# Patient Record
Sex: Male | Born: 1995
Health system: Southern US, Community
[De-identification: ages and names within clinical notes are randomized; demographics above are authoritative.]

---
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
## 2016-10-07 ENCOUNTER — Ambulatory Visit (INDEPENDENT_AMBULATORY_CARE_PROVIDER_SITE_OTHER): Payer: BLUE CROSS/BLUE SHIELD | Admitting: Medical

## 2016-10-07 ENCOUNTER — Encounter: Payer: Self-pay | Admitting: Medical

## 2016-10-07 VITALS — BP 123/64 | HR 72 | Temp 98.0°F | Resp 16 | Ht 65.75 in | Wt 127.4 lb

## 2016-10-07 DIAGNOSIS — L0390 Cellulitis, unspecified: Secondary | ICD-10-CM

## 2016-10-07 MED ORDER — CEFTRIAXONE SODIUM 1 G IJ SOLR
1.0000 g | Freq: Once | INTRAMUSCULAR | Status: AC
  Administered 2016-10-07: 1 g via INTRAMUSCULAR

## 2016-10-07 MED ORDER — DOXYCYCLINE HYCLATE 100 MG PO TABS: 100.0000 mg | ORAL_TABLET | Freq: Two times a day (BID) | ORAL | 0 refills | Status: DC

## 2016-10-07 NOTE — Patient Instructions (Addendum)
Your lower abdomen/suprapubic region appears to have cellulitis but not abscess.  We gave you rocphen 1 gram and rx doxycycline oral antibiotic.  Apply warm compresses twice daily.(epson salt water soaked gauzes).  If area of redness expands or if soft center to infected area with increased pain then ED evaluation for I and D.   Follow up Tuesday or sooner if needed

## 2016-10-07 NOTE — Progress Notes (Signed)
   Subjective:    Patient ID: Mark Collier, male    DOB: 1996/08/18, 21 y.o.   MRN: [id number]  HPI  Pt in with swollen mid lower suprapubic area present for 5 days. Pt hair is shaved in suprapubic region. Pt states noticed after wrestling the area feels worse. No upcoming matches for 3 weeks.   Pt has no fevers, no chills or sweats.  No history of skin infections. No dc reported.     Review of Systems  Constitutional: Negative for chills, fatigue and fever.  Respiratory: Negative for cough, choking, shortness of breath and wheezing.   Cardiovascular: Negative for chest pain and palpitations.  Gastrointestinal: Negative for abdominal pain.  Skin:       Cellulitis in lower abdomen suprapubic region.  Hematological: Negative for adenopathy. Does not bruise/bleed easily.  Psychiatric/Behavioral: Negative for behavioral problems and confusion.   No past medical history on file.   Social History   Social History  . Marital status: Single    Spouse name: N/A  . Number of children: N/A  . Years of education: N/A   Occupational History  . Not on file.   Social History Main Topics  . Smoking status: Never Smoker  . Smokeless tobacco: Never Used  . Alcohol use No  . Drug use: No  . Sexual activity: Not on file   Other Topics Concern  . Not on file   Social History Narrative  . No narrative on file    No past surgical history on file.  Family History  Problem Relation Age of Onset  . Hyperlipidemia    . Heart attack Maternal Grandfather     mggf  . Angina Paternal Grandfather   . Lung cancer Maternal Grandfather     mggf  . Endometriosis Maternal Grandmother     mggm  . Hypertension    . COPD Paternal Grandmother     smoker    No Known Allergies  Current Outpatient Prescriptions on File Prior to Visit  Medication Sig Dispense Refill  . mupirocin ointment (BACTROBAN) 2 %     . naftifine (NAFTIN) 1 % cream Apply topically daily. 30 g 0   No current  facility-administered medications on file prior to visit.     BP 123/64 (BP Location: Left Arm, Patient Position: Sitting, Cuff Size: Normal)   Pulse 72   Temp 98 F (36.7 C) (Oral)   Resp 16   Ht 5' 5.75" (1.67 m)   Wt 127 lb 6 oz (57.8 kg)   SpO2 100%   BMI 20.72 kg/m       Objective:   Physical Exam  General- No acute distress. Pleasant patient.  Lungs- Clear, even and unlabored. Heart- regular rate and rhythm. Neurologic- CNII- XII grossly intact.  Abdomen- lower suprapubic mid-line 2 cm red area. Inflammed and tender but not fluctuant in the middle.   Back- no cva tenderness        Assessment & Plan:  Your lower abdomen/suprapubic region appears to have cellulitis but not abscess.  We gave you rocphein 1 gram and rx doxycycline oral antibiotic.  Apply warm compresses twice daily.(epson salt water soaked gauzes).  If area of redness expands or if soft center to infected area with increased pain then ED evaluation for I and D.   Follow up Tuesday or sooner if needed

## 2016-10-07 NOTE — Progress Notes (Signed)
Pre visit review using our clinic review tool, if applicable. No additional management support is needed unless otherwise documented below in the visit note/SLS  

## 2016-10-11 ENCOUNTER — Encounter: Payer: Self-pay | Admitting: Medical

## 2016-10-11 ENCOUNTER — Ambulatory Visit (INDEPENDENT_AMBULATORY_CARE_PROVIDER_SITE_OTHER): Payer: BLUE CROSS/BLUE SHIELD | Admitting: Medical

## 2016-10-11 VITALS — BP 132/75 | HR 86 | Temp 98.0°F | Resp 16 | Ht 66.0 in | Wt 122.2 lb

## 2016-10-11 DIAGNOSIS — L089 Local infection of the skin and subcutaneous tissue, unspecified: Secondary | ICD-10-CM | POA: Diagnosis not present

## 2016-10-11 MED ORDER — DOXYCYCLINE HYCLATE 100 MG PO TABS: 100.0000 mg | ORAL_TABLET | Freq: Two times a day (BID) | ORAL | 0 refills | Status: DC

## 2016-10-11 NOTE — Progress Notes (Signed)
Pre visit review using our clinic review tool, if applicable. No additional management support is needed unless otherwise documented below in the visit note/SLS  

## 2016-10-11 NOTE — Patient Instructions (Signed)
Your are of skin infection is healing very well now. By description abscess did form and did burst as well. Continue the oral antibiotic for 5 day. Will provide an additional 3 days of doxycyline to use if you need. I will go ahead and write return to wrestling practice in 7 days provided you continue to see improvement and have no further drainage in 7 days. If you do see any drainage then need to see you back in a week for recheck/reeval.

## 2016-10-11 NOTE — Progress Notes (Signed)
Subjective:    Patient ID: Mark Collier, male    DOB: [birth year], 21 y.o.   MRN: [id number]  HPI   Pt in for follow up. Seen on 19 th for suprapubic area cellulitis. States taking antiibiotics well with no side effect. 2 days ago he woke up and saw area burst and drainage. Only faint tender now at drainage site. No fever, no chills or sweats.    Review of Systems  Constitutional: Negative for chills, fatigue and fever.  HENT: Positive for congestion.        Counseled on delsym otc for cough and congestion. If symptosm worsen or change let us know.  Respiratory: Negative for cough, chest tightness, shortness of breath and wheezing.   Cardiovascular: Negative for chest pain and palpitations.  Gastrointestinal: Negative for abdominal pain, blood in stool, constipation, diarrhea and nausea.  Musculoskeletal: Negative for back pain.  Skin: Negative for rash.  Neurological: Negative for dizziness and headaches.  Hematological: Negative for adenopathy. Does not bruise/bleed easily.  Psychiatric/Behavioral: Negative for behavioral problems and confusion.   No past medical history on file.   Social History   Social History  . Marital status: Single    Spouse name: N/A  . Number of children: N/A  . Years of education: N/A   Occupational History  . Not on file.   Social History Main Topics  . Smoking status: Never Smoker  . Smokeless tobacco: Never Used  . Alcohol use No  . Drug use: No  . Sexual activity: Not on file   Other Topics Concern  . Not on file   Social History Narrative  . No narrative on file    No past surgical history on file.  Family History  Problem Relation Age of Onset  . Hyperlipidemia    . Heart attack Maternal Grandfather     mggf  . Angina Paternal Grandfather   . Lung cancer Maternal Grandfather     mggf  . Endometriosis Maternal Grandmother     mggm  . Hypertension    . COPD Paternal Grandmother     smoker    No Known  Allergies  Current Outpatient Prescriptions on File Prior to Visit  Medication Sig Dispense Refill  . doxycycline (VIBRA-TABS) 100 MG tablet Take 1 tablet (100 mg total) by mouth 2 (two) times daily. 20 tablet 0  . mupirocin ointment (BACTROBAN) 2 %     . naftifine (NAFTIN) 1 % cream Apply topically daily. 30 g 0   No current facility-administered medications on file prior to visit.     BP 132/75 (BP Location: Left Arm, Patient Position: Sitting, Cuff Size: Normal)   Pulse 86   Temp 98 F (36.7 C) (Oral)   Resp 16   Ht 5\' 6"  (1.676 m)   Wt 122 lb 4 oz (55.5 kg)   SpO2 99%   BMI 19.73 kg/m       Objective:   Physical Exam  General- No acute distress. Pleasant patient.  .  Abdomen- lower suprapubic prior area of skin infection now flat faint pink. Not warm and not tender.  Back- no cva tenderness      Assessment & Plan:  Your are of skin infection is healing very well now. By description abscess did form and did burst as well. Continue the oral antibiotic for 5 day. Will provide an additional 3 days of doxycyline to use if you need. I will go ahead and write return to wrestling practice in  7 days provided you continue to see improvement and have no further drainage in 7 days. If you do see any drainage then need to see you back in a week for recheck/reeval.

## 2017-02-09 ENCOUNTER — Emergency Department (HOSPITAL_COMMUNITY)
Admission: EM | Admit: 2017-02-09 | Discharge: 2017-02-09 | Disposition: A | Discharge status: Home / Self Care | Payer: BLUE CROSS/BLUE SHIELD | Attending: Emergency Medicine | Admitting: Emergency Medicine

## 2017-02-09 ENCOUNTER — Encounter (HOSPITAL_COMMUNITY): Payer: Self-pay

## 2017-02-09 DIAGNOSIS — Y999 Unspecified external cause status: Secondary | ICD-10-CM | POA: Diagnosis not present

## 2017-02-09 DIAGNOSIS — Z79899 Other long term (current) drug therapy: Secondary | ICD-10-CM | POA: Insufficient documentation

## 2017-02-09 DIAGNOSIS — Y9372 Activity, wrestling: Secondary | ICD-10-CM | POA: Insufficient documentation

## 2017-02-09 DIAGNOSIS — S0101XA Laceration without foreign body of scalp, initial encounter: Secondary | ICD-10-CM | POA: Diagnosis not present

## 2017-02-09 DIAGNOSIS — S0990XA Unspecified injury of head, initial encounter: Secondary | ICD-10-CM

## 2017-02-09 DIAGNOSIS — Z23 Encounter for immunization: Secondary | ICD-10-CM | POA: Diagnosis not present

## 2017-02-09 DIAGNOSIS — W1789XA Other fall from one level to another, initial encounter: Secondary | ICD-10-CM | POA: Insufficient documentation

## 2017-02-09 DIAGNOSIS — Y929 Unspecified place or not applicable: Secondary | ICD-10-CM | POA: Diagnosis not present

## 2017-02-09 MED ORDER — TETANUS-DIPHTH-ACELL PERTUSSIS 5-2.5-18.5 LF-MCG/0.5 IM SUSP
0.5000 mL | Freq: Once | INTRAMUSCULAR | Status: AC
  Administered 2017-02-09: 0.5 mL via INTRAMUSCULAR
  Filled 2017-02-09: qty 0.5

## 2017-02-09 NOTE — ED Provider Notes (Signed)
TIME SEEN: 1:09 AM   02/09/2017  CHIEF COMPLAINT: Head injury  HPI: This is a 21 year old male without chronic medical problems who presents to the ED following a head injury. The patient states that three hours ago he was "wrestling with his friend" when he struck his forehead against the side of his wooden porch. No LOC. He has a laceration to the R forehead at the hairline with bleeding and mild associated pain. He denies any other injuries. No chest pain or dyspnea. No neck or back pain. Did have some dizziness after but this has resolved. No severe headache or nausea, vomiting. No numbness or focal weakness. Not on anticoagulation, antiplatelets. Reports 1 shot of liquor tonight. No drug use.  ROS: See HPI Constitutional: no fever  Eyes: no drainage  ENT: no runny nose   Cardiovascular:  no chest pain  Resp: no SOB  GI: no vomiting GU: no dysuria Integumentary: no rash + laceration  Allergy: no hives  Musculoskeletal: no leg swelling  Neurological: no slurred speech ROS otherwise negative  PAST MEDICAL HISTORY/PAST SURGICAL HISTORY:  History reviewed. No pertinent past medical history.  MEDICATIONS:  Prior to Admission medications   Medication Sig Start Date End Date Taking? Authorizing Provider  doxycycline (VIBRA-TABS) 100 MG tablet Take 1 tablet (100 mg total) by mouth 2 (two) times daily. 10/11/16   Saguier, Ramon DredgeEdward, PA-C  mupirocin ointment (BACTROBAN) 2 %  12/29/13   [provider]  naftifine (NAFTIN) 1 % cream Apply topically daily. 03/11/14   Donato SchultzLowne Chase, Yvonne R, DO    ALLERGIES:  No Known Allergies  SOCIAL HISTORY:  Social History  Substance Use Topics  . Smoking status: Never Smoker  . Smokeless tobacco: Never Used  . Alcohol use No    FAMILY HISTORY: Family History  Problem Relation Age of Onset  . Hyperlipidemia Unknown   . Heart attack Maternal Grandfather        mggf  . Lung cancer Maternal Grandfather        mggf  . Angina Paternal  Grandfather   . Endometriosis Maternal Grandmother        mggm  . Hypertension Unknown   . COPD Paternal Grandmother        smoker    EXAM: BP 122/74 (BP Location: Right Arm)   Pulse 88   Temp 97.2 F (36.2 C) (Oral)   Resp 18   Ht 5\' 6"  (1.676 m)   Wt 120 lb (54.4 kg)   SpO2 98%   BMI 19.37 kg/m  CONSTITUTIONAL: Alert and oriented and responds appropriately to questions. Well-appearing; well-nourished; GCS 15 HEAD: Normocephalic; there is a 3cm laceration to the R forehead above the hairline. EYES: Conjunctivae clear, PERRL, EOMI ENT: normal nose; no rhinorrhea; moist mucous membranes; pharynx without lesions noted; no dental injury; no septal hematoma NECK: Supple, no meningismus, no LAD; no midline spinal tenderness, step-off or deformity; trachea midline CARD: RRR; S1 and S2 appreciated; no murmurs, no clicks, no rubs, no gallops RESP: Normal chest excursion without splinting or tachypnea; breath sounds clear and equal bilaterally; no wheezes, no rhonchi, no rales; no hypoxia or respiratory distress CHEST:  chest wall stable, no crepitus or ecchymosis or deformity, nontender to palpation; no flail chest ABD/GI: Normal bowel sounds; non-distended; soft, non-tender, no rebound, no guarding; no ecchymosis or other lesions noted PELVIS:  stable, nontender to palpation BACK:  The back appears normal and is non-tender to palpation, there is no CVA tenderness; no midline spinal  tenderness, step-off or deformity EXT: Normal ROM in all joints; non-tender to palpation; no edema; normal capillary refill; no cyanosis, no bony tenderness or bony deformity of patient's extremities, no joint effusion, compartments are soft, extremities are warm and well-perfused, no ecchymosis SKIN: Normal color for age and race; warm NEURO: Moves all extremities equally, sensation to light touch intact diffusely, cranial nerves II through XII intact, normal speech PSYCH: The patient's mood and manner are  appropriate. Grooming and personal hygiene are appropriate.  MEDICAL DECISION MAKING: Patient here with minor head injury with small head laceration has been repaired with staples. Neurologically intact, hemodynamically stable. No other sign of trauma on exam. Does not appear intoxicated. Discussed head injury return precautions. We'll update his tetanus vaccination. Discussed wound care instructions. He has a PCP to have his staples removed in one week. I do not feel he needs emergent head imaging. Patient family comfortable with this plan. Recommended alternating Tylenol and Motrin at home as needed for pain.  At this time, I do not feel there is any life-threatening condition present. I have reviewed and discussed all results (EKG, imaging, lab, urine as appropriate) and exam findings with patient/family. I have reviewed nursing notes and appropriate previous records.  I feel the patient is safe to be discharged home without further emergent workup and can continue workup as an outpatient as needed. Discussed usual and customary return precautions. Patient/family verbalize understanding and are comfortable with this plan.  Outpatient follow-up has been provided if needed. All questions have been answered.  LACERATION REPAIR Performed by: Raelyn Number Authorized by: Raelyn Number Consent: Verbal consent obtained. Risks and benefits: risks, benefits and alternatives were discussed Consent given by: patient Patient identity confirmed: provided demographic data Prepped and Draped in normal sterile fashion Wound explored  Laceration Location: Scalp  Laceration Length: 3 cm  No Foreign Bodies seen or palpated  Anesthesia: local infiltration  Local anesthetic: Patient declined   Anesthetic total: 0 ml  Irrigation method: syringe Amount of cleaning: standard  Skin closure: Staples   Number of sutures/staples: 3 staples   Technique: Wound irrigated copiously with sterile saline. .  Wound closed using 3 staples.  Sterile dressing applied. Good wound approximation and hemostasis achieved.    Patient tolerance: Patient tolerated the procedure well with no immediate complications.     I personally performed the services described in this documentation, which was scribed in my presence. The recorded information has been reviewed and is accurate.    Ward, Layla Maw, DO 02/09/17 [phone]

## 2017-02-09 NOTE — ED Notes (Signed)
ED Provider at bedside. 

## 2017-02-09 NOTE — Discharge Instructions (Signed)
You may alternate Tylenol 1000 mg every 6 hours as needed for pain and Ibuprofen 800 mg every 8 hours as needed for pain.  Please take Ibuprofen with food.   Please avoid alcohol for the next week.  Please rest and drink plenty of water.  We recommend that you avoid any activity that may lead to another head injury for at least 1 week or until your symptoms have completely resolved.  We also recommend "brain rest" - please avoid TV, cell phones, tablets, computers as much as possible for the next 48 hours.     I recommend you stay out of work tomorrow. Your safe to drive as long as you're not experiencing severe headache, dizziness, nausea and vomiting, vision changes.

## 2017-02-09 NOTE — ED Triage Notes (Signed)
Pt here for fall from porch. Laceration noted to forehead.

## 2017-07-20 ENCOUNTER — Other Ambulatory Visit: Payer: Self-pay | Admitting: *Deleted

## 2017-07-20 ENCOUNTER — Ambulatory Visit (INDEPENDENT_AMBULATORY_CARE_PROVIDER_SITE_OTHER): Payer: BLUE CROSS/BLUE SHIELD

## 2017-07-20 DIAGNOSIS — Z23 Encounter for immunization: Secondary | ICD-10-CM | POA: Diagnosis not present

## 2017-09-19 HISTORY — PX: APPENDECTOMY: SHX54

## 2017-09-29 ENCOUNTER — Encounter: Payer: Self-pay | Admitting: Family Medicine

## 2017-09-29 ENCOUNTER — Ambulatory Visit (INDEPENDENT_AMBULATORY_CARE_PROVIDER_SITE_OTHER): Payer: BLUE CROSS/BLUE SHIELD | Admitting: Family Medicine

## 2017-09-29 VITALS — BP 98/68 | HR 92 | Temp 97.7°F | Resp 16 | Ht 66.0 in | Wt 115.2 lb

## 2017-09-29 DIAGNOSIS — Z0000 Encounter for general adult medical examination without abnormal findings: Secondary | ICD-10-CM

## 2017-09-29 DIAGNOSIS — Z23 Encounter for immunization: Secondary | ICD-10-CM | POA: Diagnosis not present

## 2017-09-29 NOTE — Assessment & Plan Note (Signed)
ghm utd Check labs See AVS 

## 2017-09-29 NOTE — Progress Notes (Addendum)
Patient ID: Mark Collier, male    DOB: 1996/08/12  Age: 22 y.o. MRN: [id number]    Subjective:  Subjective  HPI Mark Collier presents for cpe , no complaints.    Review of Systems  Constitutional: Negative for chills and fever.  HENT: Negative for congestion and hearing loss.   Eyes: Negative for discharge.  Respiratory: Negative for cough and shortness of breath.   Cardiovascular: Negative for chest pain, palpitations and leg swelling.  Gastrointestinal: Negative for abdominal pain, blood in stool, constipation, diarrhea, nausea and vomiting.  Genitourinary: Negative for dysuria, frequency, hematuria and urgency.  Musculoskeletal: Negative for back pain and myalgias.  Skin: Negative for rash.  Allergic/Immunologic: Negative for environmental allergies.  Neurological: Negative for dizziness, weakness and headaches.  Hematological: Does not bruise/bleed easily.  Psychiatric/Behavioral: Negative for suicidal ideas. The patient is not nervous/anxious.     History History reviewed. No pertinent past medical history.  He has no past surgical history on file.   His family history includes Angina in his paternal grandfather; COPD in his paternal grandmother; Endometriosis in his maternal grandmother; Heart attack in his maternal grandfather; Hyperlipidemia in his unknown relative; Hypertension in his unknown relative; Lung cancer in his maternal grandfather.He reports that  has never smoked. he has never used smokeless tobacco. He reports that he uses drugs. Drug: Marijuana. He reports that he does not drink alcohol.  No current outpatient medications on file prior to visit.   No current facility-administered medications on file prior to visit.      Objective:  Objective  Physical Exam  Constitutional: He is oriented to person, place, and time. Vital signs are normal. He appears well-developed and well-nourished. He is sleeping.  HENT:  Head: Normocephalic and atraumatic.    Mouth/Throat: Oropharynx is clear and moist.  Eyes: EOM are normal. Pupils are equal, round, and reactive to light.  Neck: Normal range of motion. Neck supple. No thyromegaly present.  Cardiovascular: Normal rate and regular rhythm.  No murmur heard. Pulmonary/Chest: Effort normal and breath sounds normal. No respiratory distress. He has no wheezes. He has no rales. He exhibits no tenderness.  Abdominal: Soft. Bowel sounds are normal. He exhibits no distension and no mass. There is no tenderness. There is no rebound and no guarding. No hernia. Hernia confirmed negative in the ventral area, confirmed negative in the right inguinal area and confirmed negative in the left inguinal area.  Musculoskeletal: He exhibits no edema or tenderness.  Neurological: He is alert and oriented to person, place, and time.  Skin: Skin is warm and dry.  Psychiatric: He has a normal mood and affect. His behavior is normal. Judgment and thought content normal.  Nursing note and vitals reviewed.  BP 98/68 (BP Location: Left Arm, Cuff Size: Normal)   Pulse 92   Temp 97.7 F (36.5 C) (Oral)   Resp 16   Ht 5\' 6"  (1.676 m)   Wt 115 lb 3.2 oz (52.3 kg)   SpO2 96%   BMI 18.59 kg/m  Wt Readings from Last 3 Encounters:  09/29/17 115 lb 3.2 oz (52.3 kg)  02/09/17 120 lb (54.4 kg)  10/11/16 122 lb 4 oz (55.5 kg) (5 %, Z= -1.65)*   * Growth percentiles are based on CDC (Boys, 2-20 Years) data.     Lab Results  Component Value Date   WBC 4.6 11/06/2008   HGB 14.4 11/06/2008   HCT 41.7 11/06/2008   PLT 202 11/06/2008   GLUCOSE 95 11/06/2008  CHOL 156 11/06/2008   TRIG 55 11/06/2008   HDL 48.8 11/06/2008   LDLCALC 96 11/06/2008   ALT 22 11/06/2008   AST 29 11/06/2008   NA 140 11/06/2008   K 5.1 11/06/2008   CL 107 11/06/2008   CREATININE 0.5 11/06/2008   BUN 9 11/06/2008   CO2 26 11/06/2008    No results found.   Assessment & Plan:  Plan  I have discontinued Mark Collier's mupirocin ointment,  naftifine, and doxycycline.  No orders of the defined types were placed in this encounter.   Problem List Items Addressed This Visit      Unprioritized   Preventative health care - Primary    ghm utd Check labs See AVS      Relevant Orders   Comprehensive metabolic panel   CBC with Differential/Platelet   Lipid panel   TSH    menveo, men B and flu given today   Follow-up: Return in about 1 month (around 10/30/2017) for men B  #2.  Mark SchultzYvonne R Lowne Chase, DO

## 2017-09-29 NOTE — Patient Instructions (Signed)
Preventive Care 18-39 Years, Male Preventive care refers to lifestyle choices and visits with your health care provider that can promote health and wellness. What does preventive care include?  A yearly physical exam. This is also called an annual well check.  Dental exams once or twice a year.  Routine eye exams. Ask your health care provider how often you should have your eyes checked.  Personal lifestyle choices, including: ? Daily care of your teeth and gums. ? Regular physical activity. ? Eating a healthy diet. ? Avoiding tobacco and drug use. ? Limiting alcohol use. ? Practicing safe sex. What happens during an annual well check? The services and screenings done by your health care provider during your annual well check will depend on your age, overall health, lifestyle risk factors, and family history of disease. Counseling Your health care provider may ask you questions about your:  Alcohol use.  Tobacco use.  Drug use.  Emotional well-being.  Home and relationship well-being.  Sexual activity.  Eating habits.  Work and work Statistician.  Screening You may have the following tests or measurements:  Height, weight, and BMI.  Blood pressure.  Lipid and cholesterol levels. These may be checked every 5 years starting at age 34.  Diabetes screening. This is done by checking your blood sugar (glucose) after you have not eaten for a while (fasting).  Skin check.  Hepatitis C blood test.  Hepatitis B blood test.  Sexually transmitted disease (STD) testing.  Discuss your test results, treatment options, and if necessary, the need for more tests with your health care provider. Vaccines Your health care provider may recommend certain vaccines, such as:  Influenza vaccine. This is recommended every year.  Tetanus, diphtheria, and acellular pertussis (Tdap, Td) vaccine. You may need a Td booster every 10 years.  Varicella vaccine. You may need this if you  have not been vaccinated.  HPV vaccine. If you are 23 or younger, you may need three doses over 6 months.  Measles, mumps, and rubella (MMR) vaccine. You may need at least one dose of MMR.You may also need a second dose.  Pneumococcal 13-valent conjugate (PCV13) vaccine. You may need this if you have certain conditions and have not been vaccinated.  Pneumococcal polysaccharide (PPSV23) vaccine. You may need one or two doses if you smoke cigarettes or if you have certain conditions.  Meningococcal vaccine. One dose is recommended if you are age 65-21 years and a first-year college student living in a residence hall, or if you have one of several medical conditions. You may also need additional booster doses.  Hepatitis A vaccine. You may need this if you have certain conditions or if you travel or work in places where you may be exposed to hepatitis A.  Hepatitis B vaccine. You may need this if you have certain conditions or if you travel or work in places where you may be exposed to hepatitis B.  Haemophilus influenzae type b (Hib) vaccine. You may need this if you have certain risk factors.  Talk to your health care provider about which screenings and vaccines you need and how often you need them. This information is not intended to replace advice given to you by your health care provider. Make sure you discuss any questions you have with your health care provider. Document Released: 11/01/2001 Document Revised: 05/25/2016 Document Reviewed: 07/07/2015 Elsevier Interactive Patient Education  [employer].

## 2017-09-29 NOTE — Addendum Note (Signed)
Addended by: Thelma BargeICHARDSON, Jalyn Dutta D on: 09/29/2017 02:54 PM   Modules accepted: Orders

## 2017-10-03 NOTE — Addendum Note (Signed)
Addended by: Thelma BargeICHARDSON, Christinia Lambeth D on: 10/03/2017 08:31 AM   Modules accepted: Orders

## 2017-11-03 ENCOUNTER — Ambulatory Visit: Payer: BLUE CROSS/BLUE SHIELD

## 2017-11-10 ENCOUNTER — Ambulatory Visit (INDEPENDENT_AMBULATORY_CARE_PROVIDER_SITE_OTHER): Payer: BLUE CROSS/BLUE SHIELD

## 2017-11-10 DIAGNOSIS — Z23 Encounter for immunization: Secondary | ICD-10-CM

## 2017-11-10 NOTE — Progress Notes (Signed)
Patient in today to receive his second Men B vaccine.  He tolerated it well.

## 2018-10-18 ENCOUNTER — Encounter: Payer: Self-pay | Admitting: Internal Medicine

## 2018-10-18 ENCOUNTER — Ambulatory Visit (INDEPENDENT_AMBULATORY_CARE_PROVIDER_SITE_OTHER): Payer: Managed Care, Other (non HMO) | Admitting: Internal Medicine

## 2018-10-18 VITALS — BP 122/70 | HR 112 | Temp 98.0°F | Resp 16 | Ht 66.0 in | Wt 119.1 lb

## 2018-10-18 DIAGNOSIS — N5089 Other specified disorders of the male genital organs: Secondary | ICD-10-CM

## 2018-10-18 NOTE — Progress Notes (Signed)
Subjective:    Patient ID: Mark Collier, male    DOB: September 01, 1996, 23 y.o.   MRN: [id number]  DOS:  10/18/2018 Type of visit - description: Acute visit Develop a lump on the right side of the scrotum few months ago, it went away. The lump come back approximately 3 weeks ago and is causing some discomfort.  Review of Systems Denies fever chills No rash in the area No dysuria, gross hematuria or difficulty urinating. No penile discharge  No past medical history on file.  No past surgical history on file.  Social History   Socioeconomic History  . Marital status: Single    Spouse name: Not on file  . Number of children: Not on file  . Years of education: Not on file  . Highest education level: Not on file  Occupational History  . Not on file  Social Needs  . Financial resource strain: Not on file  . Food insecurity:    Worry: Not on file    Inability: Not on file  . Transportation needs:    Medical: Not on file    Non-medical: Not on file  Tobacco Use  . Smoking status: Never Smoker  . Smokeless tobacco: Never Used  Substance and Sexual Activity  . Alcohol use: No    Comment: rare  . Drug use: Yes    Types: Marijuana    Comment: last use --1-2 x in last six months   . Sexual activity: Yes    Partners: Female    Birth control/protection: Condom  Lifestyle  . Physical activity:    Days per week: 3 days    Minutes per session: 60 min  . Stress: Not at all  Relationships  . Social connections:    Talks on phone: Not on file    Gets together: Not on file    Attends religious service: Not on file    Active member of club or organization: Not on file    Attends meetings of clubs or organizations: Not on file    Relationship status: Not on file  . Intimate partner violence:    Fear of current or ex partner: No    Emotionally abused: No    Physically abused: No    Forced sexual activity: No  Other Topics Concern  . Not on file  Social History Narrative   Exercise- workout at gym 3 times a week      Allergies as of 10/18/2018   No Known Allergies     Medication List    as of October 18, 2018 11:59 PM   You have not been prescribed any medications.         Objective:   Physical Exam Genitourinary:     BP 122/70 (BP Location: Left Arm, Patient Position: Sitting, Cuff Size: Small)   Pulse (!) 112   Temp 98 F (36.7 C) (Oral)   Resp 16   Ht 5\' 6"  (1.676 m)   Wt 119 lb 2 oz (54 kg)   SpO2 95%   BMI 19.23 kg/m  General:   Well developed, NAD, BMI noted.  HEENT:  Normocephalic . Face symmetric, atraumatic  Abdomen:  Not distended, soft, non-tender. No rebound or rigidity. No inguinal hernias noted. Groins without LAD is or mass. GU: Scrotal contents: Has two symmetric testicles, they are not tender or hard.   At the proximal epididymis bilaterally I feel a couple of lumps, probably cysts. At the right proximal area I do feel  the lump that the patient felt:  it is soft, not tender, measures ~ 1x1.5 cm, soft, minimally tender, not fluctuant. Penis: Normal, no lesions, discharge or ulcers Skin: Not pale. Not jaundice Neurologic:  alert & oriented X3.  Speech normal, gait appropriate for age and unassisted Psych--  Cognition and judgment appear intact.  Cooperative with normal attention span and concentration.  Behavior appropriate. No anxious or depressed appearing.     Assessment    23 year old male, healthy, presents with: Mass, right the scrotum: The mass seems located outside the testicle, it is close to the right of spermatic cord. Advised patient that it is extremely important we proceed with further eval, first step should be a ultrasound. If the ultrasound is not conclusively benign,  I will refer him to urology

## 2018-10-18 NOTE — Progress Notes (Signed)
Pre visit review using our clinic review tool, if applicable. No additional management support is needed unless otherwise documented below in the visit note. 

## 2018-10-18 NOTE — Patient Instructions (Signed)
  Will schedule a ultrasound  If you don't hear from [place] in 1 week, please call the office

## 2018-10-23 ENCOUNTER — Encounter (HOSPITAL_BASED_OUTPATIENT_CLINIC_OR_DEPARTMENT_OTHER): Payer: Self-pay | Admitting: Radiology

## 2018-10-23 ENCOUNTER — Ambulatory Visit (HOSPITAL_BASED_OUTPATIENT_CLINIC_OR_DEPARTMENT_OTHER): Payer: Managed Care, Other (non HMO)

## 2018-10-23 ENCOUNTER — Ambulatory Visit (HOSPITAL_BASED_OUTPATIENT_CLINIC_OR_DEPARTMENT_OTHER)
Admission: RE | Admit: 2018-10-23 | Discharge: 2018-10-23 | Disposition: A | Discharge status: Home / Self Care | Payer: Managed Care, Other (non HMO) | Source: Ambulatory Visit | Attending: Internal Medicine | Admitting: Internal Medicine

## 2018-10-23 DIAGNOSIS — N5082 Scrotal pain: Secondary | ICD-10-CM | POA: Insufficient documentation

## 2018-10-23 DIAGNOSIS — N5089 Other specified disorders of the male genital organs: Secondary | ICD-10-CM | POA: Diagnosis present

## 2019-02-27 ENCOUNTER — Telehealth: Payer: Self-pay | Admitting: Internal Medicine

## 2019-02-27 NOTE — Telephone Encounter (Signed)
Follow up scheduled. Thank you!

## 2019-02-27 NOTE — Telephone Encounter (Signed)
Please call the patient and arrange for a  in person office visit, he needs a follow-up for a problem with the genital area. If he is unable to come to the office, please let me know.

## 2019-03-05 ENCOUNTER — Encounter: Payer: Self-pay | Admitting: Family Medicine

## 2019-03-05 ENCOUNTER — Ambulatory Visit: Payer: Managed Care, Other (non HMO) | Admitting: Family Medicine

## 2019-03-05 ENCOUNTER — Other Ambulatory Visit: Payer: Self-pay

## 2019-03-05 VITALS — BP 131/74 | HR 86 | Temp 98.2°F | Resp 18 | Ht 66.0 in | Wt 121.6 lb

## 2019-03-05 DIAGNOSIS — L739 Follicular disorder, unspecified: Secondary | ICD-10-CM | POA: Diagnosis not present

## 2019-03-05 MED ORDER — DOXYCYCLINE HYCLATE 100 MG PO TABS: 100.0000 mg | ORAL_TABLET | Freq: Two times a day (BID) | ORAL | 0 refills | Status: AC

## 2019-03-05 NOTE — Patient Instructions (Signed)

## 2019-03-05 NOTE — Progress Notes (Signed)
Patient ID: Mark Collier, male    DOB: 1996-07-30  Age: 23 y.o. MRN: [id number]    Subjective:  Subjective  HPI Mark Collier presents for papules in pubic area.  Tender--- he did have some pus come out of them No fevers   Review of Systems  Constitutional: Negative for appetite change, diaphoresis, fatigue and unexpected weight change.  Eyes: Negative for pain, redness and visual disturbance.  Respiratory: Negative for cough, chest tightness, shortness of breath and wheezing.   Cardiovascular: Negative for chest pain, palpitations and leg swelling.  Endocrine: Negative for cold intolerance, heat intolerance, polydipsia, polyphagia and polyuria.  Genitourinary: Negative for difficulty urinating, dysuria and frequency.  Skin: Positive for rash.  Neurological: Negative for dizziness, light-headedness, numbness and headaches.    History No past medical history on file.  He has a past surgical history that includes Appendectomy (2019).   His family history includes Angina in his paternal grandfather; COPD in his paternal grandmother; Endometriosis in his maternal grandmother; Heart attack in his maternal grandfather; Hyperlipidemia in an other family member; Hypertension in an other family member; Lung cancer in his maternal grandfather.He reports that he has never smoked. He has never used smokeless tobacco. He reports current drug use. Drug: Marijuana. He reports that he does not drink alcohol.  No current outpatient medications on file prior to visit.   No current facility-administered medications on file prior to visit.      Objective:  Objective  Physical Exam Vitals signs and nursing note reviewed.  Constitutional:      General: He is sleeping.     Appearance: He is well-developed.  HENT:     Head: Normocephalic and atraumatic.  Eyes:     Pupils: Pupils are equal, round, and reactive to light.  Neck:     Musculoskeletal: Normal range of motion and neck supple.   Thyroid: No thyromegaly.  Cardiovascular:     Rate and Rhythm: Normal rate and regular rhythm.     Heart sounds: No murmur.  Pulmonary:     Effort: Pulmonary effort is normal. No respiratory distress.     Breath sounds: Normal breath sounds. No wheezing or rales.  Chest:     Chest wall: No tenderness.  Musculoskeletal:        General: No tenderness.  Skin:    General: Skin is warm and dry.       Neurological:     Mental Status: He is oriented to person, place, and time.  Psychiatric:        Behavior: Behavior normal.        Thought Content: Thought content normal.        Judgment: Judgment normal.    BP 131/74 (BP Location: Right Arm, Patient Position: Sitting, Cuff Size: Normal)    Pulse 86    Temp 98.2 F (36.8 C) (Oral)    Resp 18    Ht 5\' 6"  (1.676 m)    Wt 121 lb 9.6 oz (55.2 kg)    SpO2 99%    BMI 19.63 kg/m  Wt Readings from Last 3 Encounters:  03/05/19 121 lb 9.6 oz (55.2 kg)  10/18/18 119 lb 2 oz (54 kg)  09/29/17 115 lb 3.2 oz (52.3 kg)     Lab Results  Component Value Date   WBC 4.6 11/06/2008   HGB 14.4 11/06/2008   HCT 41.7 11/06/2008   PLT 202 11/06/2008   GLUCOSE 95 11/06/2008   CHOL 156 11/06/2008   TRIG 55  11/06/2008   HDL 48.8 11/06/2008   LDLCALC 96 11/06/2008   ALT 22 11/06/2008   AST 29 11/06/2008   NA 140 11/06/2008   K 5.1 11/06/2008   CL 107 11/06/2008   CREATININE 0.5 11/06/2008   BUN 9 11/06/2008   CO2 26 11/06/2008    [place] Scrotum  Result Date: 10/24/2018 CLINICAL DATA:  Initial evaluation for palpable lump at right spermatic cord. EXAM: SCROTAL ULTRASOUND DOPPLER ULTRASOUND OF THE TESTICLES TECHNIQUE: Complete ultrasound examination of the testicles, epididymis, and other scrotal structures was performed. Color and spectral Doppler ultrasound were also utilized to evaluate blood flow to the testicles. COMPARISON:  None available. FINDINGS: Right testicle Measurements: 5.0 x 2.3 x 3.3 cm. No mass or microlithiasis visualized. Left  testicle Measurements: 4.8 x 2.5 x 2.9 cm. No mass or microlithiasis visualized. Right epididymis:  Normal in size and appearance. Left epididymis:  Normal in size and appearance. Hydrocele:  None visualized. Varicocele:  None visualized. Pulsed Doppler interrogation of both testes demonstrates normal low resistance arterial and venous waveforms bilaterally. Targeted ultrasound of the palpable area concern was performed. Ultrasound demonstrates a 2.2 x 0.5 x 1.2 cm irregular hypoechoic collection/lesion involving the subcutaneous fat at the right upper and posterolateral aspect of the scrotum. Few internal echogenic foci suggestive of small calcifications. No internal vascularity. This positioned approximately 2-3 mm deep to the overlying skin. IMPRESSION: 1. Palpable area of concern correlates with and irregular 2.2 x 0.5 x 1.2 cm subcutaneous collection/lesion at the upper and right posterolateral aspect of the scrotal sac. Finding is indeterminate, but could reflect sequelae of prior trauma with subacute hematoma formation or possibly an irregular sebaceous cyst. Clinical follow-up to resolution recommended. 2. Otherwise normal scrotal ultrasound. Electronically Signed   By: Rise MuBenjamin  McClintock M.D.   On: 10/24/2018 02:35   [place] Pelvic Doppler (torsion R/o Or Mass Arterial Flow)  Result Date: 10/24/2018 CLINICAL DATA:  Initial evaluation for palpable lump at right spermatic cord. EXAM: SCROTAL ULTRASOUND DOPPLER ULTRASOUND OF THE TESTICLES TECHNIQUE: Complete ultrasound examination of the testicles, epididymis, and other scrotal structures was performed. Color and spectral Doppler ultrasound were also utilized to evaluate blood flow to the testicles. COMPARISON:  None available. FINDINGS: Right testicle Measurements: 5.0 x 2.3 x 3.3 cm. No mass or microlithiasis visualized. Left testicle Measurements: 4.8 x 2.5 x 2.9 cm. No mass or microlithiasis visualized. Right epididymis:  Normal in size and appearance.  Left epididymis:  Normal in size and appearance. Hydrocele:  None visualized. Varicocele:  None visualized. Pulsed Doppler interrogation of both testes demonstrates normal low resistance arterial and venous waveforms bilaterally. Targeted ultrasound of the palpable area concern was performed. Ultrasound demonstrates a 2.2 x 0.5 x 1.2 cm irregular hypoechoic collection/lesion involving the subcutaneous fat at the right upper and posterolateral aspect of the scrotum. Few internal echogenic foci suggestive of small calcifications. No internal vascularity. This positioned approximately 2-3 mm deep to the overlying skin. IMPRESSION: 1. Palpable area of concern correlates with and irregular 2.2 x 0.5 x 1.2 cm subcutaneous collection/lesion at the upper and right posterolateral aspect of the scrotal sac. Finding is indeterminate, but could reflect sequelae of prior trauma with subacute hematoma formation or possibly an irregular sebaceous cyst. Clinical follow-up to resolution recommended. 2. Otherwise normal scrotal ultrasound. Electronically Signed   By: Rise MuBenjamin  McClintock M.D.   On: 10/24/2018 02:35     Assessment & Plan:  Plan  I am having Cortavius C. Talent start on doxycycline.  Meds ordered this  encounter  Medications   doxycycline (VIBRA-TABS) 100 MG tablet    Sig: Take 1 tablet (100 mg total) by mouth 2 (two) times daily.    Dispense:  20 tablet    Refill:  0    Problem List Items Addressed This Visit    None    Visit Diagnoses    Folliculitis    -  Primary   Relevant Medications   doxycycline (VIBRA-TABS) 100 MG tablet    warn soaks-- epson salt compresses Call or rto prn   Follow-up: Return if symptoms worsen or fail to improve.  Ann Held, DO

## 2019-04-02 ENCOUNTER — Other Ambulatory Visit: Payer: Self-pay | Admitting: Family Medicine

## 2019-04-02 DIAGNOSIS — N5089 Other specified disorders of the male genital organs: Secondary | ICD-10-CM

## 2019-04-10 ENCOUNTER — Ambulatory Visit (HOSPITAL_BASED_OUTPATIENT_CLINIC_OR_DEPARTMENT_OTHER): Payer: Managed Care, Other (non HMO)

## 2019-04-24 ENCOUNTER — Ambulatory Visit (HOSPITAL_BASED_OUTPATIENT_CLINIC_OR_DEPARTMENT_OTHER): Admission: RE | Admit: 2019-04-24 | Payer: Managed Care, Other (non HMO) | Source: Ambulatory Visit
